# Patient Record
Sex: Female | Born: 1985 | Race: White | Hispanic: No | Marital: Single | Smoking: Never smoker
Health system: Southern US, Community
[De-identification: ages and names within clinical notes are randomized; demographics above are authoritative.]

## PROBLEM LIST (undated history)

## (undated) DIAGNOSIS — F410 Panic disorder [episodic paroxysmal anxiety] without agoraphobia: Secondary | ICD-10-CM

## (undated) DIAGNOSIS — G40909 Epilepsy, unspecified, not intractable, without status epilepticus: Secondary | ICD-10-CM

## (undated) DIAGNOSIS — R61 Generalized hyperhidrosis: Secondary | ICD-10-CM

## (undated) DIAGNOSIS — R002 Palpitations: Secondary | ICD-10-CM

## (undated) HISTORY — DX: Panic disorder (episodic paroxysmal anxiety): F41.0

## (undated) HISTORY — DX: Generalized hyperhidrosis: R61

## (undated) HISTORY — DX: Palpitations: R00.2

## (undated) HISTORY — DX: Epilepsy, unspecified, not intractable, without status epilepticus: G40.909

---
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
## 2008-04-01 ENCOUNTER — Emergency Department (HOSPITAL_COMMUNITY): Admission: EM | Admit: 2008-04-01 | Discharge: 2008-04-01 | Payer: Self-pay | Admitting: Emergency Medicine

## 2008-10-29 ENCOUNTER — Emergency Department (HOSPITAL_COMMUNITY): Admission: EM | Admit: 2008-10-29 | Discharge: 2008-10-29 | Payer: Self-pay | Admitting: Emergency Medicine

## 2008-12-03 ENCOUNTER — Ambulatory Visit (HOSPITAL_COMMUNITY): Admission: RE | Admit: 2008-12-03 | Discharge: 2008-12-04 | Payer: Self-pay | Admitting: Orthopaedic Surgery

## 2008-12-03 ENCOUNTER — Encounter (INDEPENDENT_AMBULATORY_CARE_PROVIDER_SITE_OTHER): Payer: Self-pay | Admitting: General Surgery

## 2010-06-28 ENCOUNTER — Emergency Department (HOSPITAL_COMMUNITY)
Admission: EM | Admit: 2010-06-28 | Discharge: 2010-06-29 | Payer: Self-pay | Source: Home / Self Care | Admitting: Emergency Medicine

## 2010-06-30 LAB — DIFFERENTIAL
Basophils Absolute: 0 10*3/uL (ref 0.0–0.1)
Basophils Relative: 0 % (ref 0–1)
Eosinophils Absolute: 0.1 10*3/uL (ref 0.0–0.7)
Eosinophils Relative: 2 % (ref 0–5)
Lymphocytes Relative: 27 % (ref 12–46)
Lymphs Abs: 1.7 10*3/uL (ref 0.7–4.0)
Monocytes Absolute: 0.5 10*3/uL (ref 0.1–1.0)
Monocytes Relative: 8 % (ref 3–12)
Neutro Abs: 4 10*3/uL (ref 1.7–7.7)
Neutrophils Relative %: 63 % (ref 43–77)

## 2010-06-30 LAB — COMPREHENSIVE METABOLIC PANEL
ALT: 10 U/L (ref 0–35)
AST: 15 U/L (ref 0–37)
Albumin: 3.9 g/dL (ref 3.5–5.2)
Alkaline Phosphatase: 31 U/L — ABNORMAL LOW (ref 39–117)
BUN: 12 mg/dL (ref 6–23)
CO2: 23 mEq/L (ref 19–32)
Calcium: 8.8 mg/dL (ref 8.4–10.5)
Chloride: 113 mEq/L — ABNORMAL HIGH (ref 96–112)
Creatinine, Ser: 0.59 mg/dL (ref 0.4–1.2)
GFR calc Af Amer: >60 mL/min (ref >60)
GFR calc non Af Amer: >60 mL/min (ref >60)
Glucose, Bld: 93 mg/dL (ref 70–99)
Potassium: 3.6 mEq/L (ref 3.5–5.1)
Sodium: 141 mEq/L (ref 135–145)
Total Bilirubin: 0.8 mg/dL (ref 0.3–1.2)
Total Protein: 6.6 g/dL (ref 6.0–8.3)

## 2010-06-30 LAB — URINALYSIS, ROUTINE W REFLEX MICROSCOPIC
Bilirubin Urine: NEGATIVE
Ketones, ur: NEGATIVE mg/dL
Leukocytes, UA: NEGATIVE
Nitrite: NEGATIVE
Protein, ur: NEGATIVE mg/dL
Specific Gravity, Urine: 1.027 (ref 1.005–1.030)
Urine Glucose, Fasting: NEGATIVE mg/dL
Urobilinogen, UA: 1 mg/dL (ref 0.0–1.0)
pH: 6 (ref 5.0–8.0)

## 2010-06-30 LAB — CBC
HCT: 37.6 % (ref 36.0–46.0)
Hemoglobin: 12.8 g/dL (ref 12.0–15.0)
MCH: 33.2 pg (ref 26.0–34.0)
MCHC: 34 g/dL (ref 30.0–36.0)
MCV: 97.4 fL (ref 78.0–100.0)
Platelets: 248 10*3/uL (ref 150–400)
RBC: 3.86 MIL/uL — ABNORMAL LOW (ref 3.87–5.11)
RDW: 12.5 % (ref 11.5–15.5)
WBC: 6.4 10*3/uL (ref 4.0–10.5)

## 2010-06-30 LAB — URINE MICROSCOPIC-ADD ON

## 2010-06-30 LAB — LIPASE, BLOOD: Lipase: 24 U/L (ref 11–59)

## 2010-06-30 LAB — POCT PREGNANCY, URINE: Preg Test, Ur: NEGATIVE

## 2010-09-22 LAB — BASIC METABOLIC PANEL
BUN: 11 mg/dL (ref 6–23)
CO2: 24 mEq/L (ref 19–32)
Calcium: 9.6 mg/dL (ref 8.4–10.5)
Chloride: 109 mEq/L (ref 96–112)
Creatinine, Ser: 0.86 mg/dL (ref 0.4–1.2)
GFR calc Af Amer: >60 mL/min (ref >60)
GFR calc non Af Amer: >60 mL/min (ref >60)
Glucose, Bld: 74 mg/dL (ref 70–99)
Potassium: 3.9 mEq/L (ref 3.5–5.1)
Sodium: 141 mEq/L (ref 135–145)

## 2010-09-22 LAB — CBC
HCT: 39.8 % (ref 36.0–46.0)
Hemoglobin: 13.5 g/dL (ref 12.0–15.0)
MCHC: 33.9 g/dL (ref 30.0–36.0)
MCV: 98.9 fL (ref 78.0–100.0)
Platelets: 235 10*3/uL (ref 150–400)
RBC: 4.03 MIL/uL (ref 3.87–5.11)
RDW: 12.6 % (ref 11.5–15.5)
WBC: 4.7 10*3/uL (ref 4.0–10.5)

## 2010-09-23 LAB — COMPREHENSIVE METABOLIC PANEL
ALT: 14 U/L (ref 0–35)
AST: 18 U/L (ref 0–37)
Albumin: 3.9 g/dL (ref 3.5–5.2)
Alkaline Phosphatase: 33 U/L — ABNORMAL LOW (ref 39–117)
BUN: 9 mg/dL (ref 6–23)
CO2: 25 mEq/L (ref 19–32)
Calcium: 8.6 mg/dL (ref 8.4–10.5)
Chloride: 109 mEq/L (ref 96–112)
Creatinine, Ser: 0.8 mg/dL (ref 0.4–1.2)
GFR calc Af Amer: >60 mL/min (ref >60)
GFR calc non Af Amer: >60 mL/min (ref >60)
Glucose, Bld: 93 mg/dL (ref 70–99)
Potassium: 3.1 mEq/L — ABNORMAL LOW (ref 3.5–5.1)
Sodium: 138 mEq/L (ref 135–145)
Total Bilirubin: 0.4 mg/dL (ref 0.3–1.2)
Total Protein: 5.9 g/dL — ABNORMAL LOW (ref 6.0–8.3)

## 2010-09-23 LAB — CBC
HCT: 37.8 % (ref 36.0–46.0)
Hemoglobin: 12.9 g/dL (ref 12.0–15.0)
MCHC: 34.1 g/dL (ref 30.0–36.0)
MCV: 99.8 fL (ref 78.0–100.0)
Platelets: 231 10*3/uL (ref 150–400)
RBC: 3.79 MIL/uL — ABNORMAL LOW (ref 3.87–5.11)
RDW: 12.6 % (ref 11.5–15.5)
WBC: 5.9 10*3/uL (ref 4.0–10.5)

## 2010-09-23 LAB — DIFFERENTIAL
Basophils Absolute: 0 10*3/uL (ref 0.0–0.1)
Basophils Relative: 0 % (ref 0–1)
Eosinophils Absolute: 0.2 10*3/uL (ref 0.0–0.7)
Eosinophils Relative: 3 % (ref 0–5)
Lymphocytes Relative: 37 % (ref 12–46)
Lymphs Abs: 2.2 10*3/uL (ref 0.7–4.0)
Monocytes Absolute: 0.6 10*3/uL (ref 0.1–1.0)
Monocytes Relative: 10 % (ref 3–12)
Neutro Abs: 2.9 10*3/uL (ref 1.7–7.7)
Neutrophils Relative %: 50 % (ref 43–77)

## 2010-09-23 LAB — URINALYSIS, ROUTINE W REFLEX MICROSCOPIC
Bilirubin Urine: NEGATIVE
Glucose, UA: NEGATIVE mg/dL
Hgb urine dipstick: NEGATIVE
Ketones, ur: NEGATIVE mg/dL
Nitrite: NEGATIVE
Protein, ur: NEGATIVE mg/dL
Specific Gravity, Urine: 1.017 (ref 1.005–1.030)
Urobilinogen, UA: 0.2 mg/dL (ref 0.0–1.0)
pH: 7.5 (ref 5.0–8.0)

## 2010-09-23 LAB — POCT PREGNANCY, URINE: Preg Test, Ur: NEGATIVE

## 2010-10-28 NOTE — Discharge Summary (Signed)
Jessica Stewart, Jessica Stewart               ACCOUNT NO.:  [id number]   MEDICAL RECORD NO.:  [id number]          PATIENT TYPE:  OIB   LOCATION:  5015                         FACILITY:  [facility]   PHYSICIAN:  Gabrielle Dare. Janee Morn, M.D.DATE OF BIRTH:  July 18, 1985   DATE OF ADMISSION:  12/03/2008  DATE OF DISCHARGE:  12/04/2008                               DISCHARGE SUMMARY   DISCHARGE DIAGNOSES:  1. Symptomatic cholelithiasis.  2. Status post laparoscopic cholecystectomy.  3. Seizure disorder.   HISTORY OF PRESENT ILLNESS:  Ms. Savarino presented for elective  cholecystectomy for symptomatic cholelithiasis.   HOSPITAL COURSE:  The patient underwent an uncomplicated laparoscopic  cholecystectomy with intraoperative cholangiogram.  Postoperatively, she  remained afebrile and hemodynamically stable.  She tolerated gradual  advancement of her diet.  She is maintained on her home antiseizure  medications and she was discharged home in stable condition on  postoperative day 1.   DISCHARGE DIET:  Low fat.   DISCHARGE ACTIVITY:  No lifting over 10 pounds.   DISCHARGE MEDICATIONS:  Oxycodone 5/325 one to two p.o. every 6 hours as  needed for pain.  In addition, she is continuing on her home medications  of Topamax 50 mg p.o. b.i.d., ondansetron 4 mg b.i.d., Lamictal 200 mg  b.i.d.   FOLLOWUP:  With myself in 2-3 weeks.      Gabrielle Dare Janee Morn, M.D.  Electronically Signed     BET/MEDQ  D:  12/04/2008  T:  12/04/2008  Job:  [id number]

## 2010-10-28 NOTE — Op Note (Signed)
NAMEHARUKA, Jessica Stewart               ACCOUNT NO.:  [id number]   MEDICAL RECORD NO.:  [id number]          PATIENT TYPE:  OIB   LOCATION:  5015                         FACILITY:  [facility]   PHYSICIAN:  Gabrielle Dare. Janee Morn, M.D.DATE OF BIRTH:  1985/11/30   DATE OF PROCEDURE:  12/03/2008  DATE OF DISCHARGE:                               OPERATIVE REPORT   PREOPERATIVE DIAGNOSIS:  Symptomatic cholelithiasis.   POSTOPERATIVE DIAGNOSIS:  Symptomatic cholelithiasis.   PROCEDURE:  Laparoscopic cholecystectomy with intraoperative  cholangiogram.   SURGEON:  Gabrielle Dare. Janee Morn, MD   ANESTHESIA:  General endotracheal.   HISTORY OF PRESENT ILLNESS:  Ms. Jessica Stewart is a 25 year old white female who  I evaluated in the office for symptomatic cholelithiasis.  She presents  today for elective cholecystectomy.   PROCEDURE IN DETAIL:  Informed consent was obtained.  The patient  identified in the preop holding area.  She received intravenous  antibiotics.  She was brought to the operating room and general  endotracheal anesthesia was administered by the anesthesia staff.  Her  abdomen was prepped and draped in a sterile fashion and time-out  procedure was performed.  An infraumbilical region was infiltrated with  0.25% Marcaine with epinephrine.  An infraumbilical incision was made.  Subcutaneous tissues were dissected down revealing the anterior fascia.  This was divided sharply.  The peritoneal cavity was entered under  direct vision without difficulty.  A 0 Vicryl pursestring suture was  placed around the fascial opening and a Hasson trocar was inserted into  the abdomen.  The abdomen was insufflated with carbon dioxide in a  standard fashion.  Under direct vision, a 11-mm epigastric and two 5-mm  mid lateral ports were placed.  A 0.25% Marcaine with epinephrine was  used at all port sites.  The dome of the gallbladder was retracted  superomedially.  This revealed a bunch of filmy omental adhesions to  the  body of the gallbladder.  These were gradually and gently taken down  using the hook cautery and blunt dissection revealing the infundibulum.  Infundibulum was retracted inferolaterally.  Dissection began laterally  and progressed medially first identified a large cystic artery.  This  was circumferentially dissected.  It was easily visualized and kind of  in the way of further dissection of the cystic duct.  So at this time,  it was clipped twice proximally and once distally and divided.  Next,  further dissection continued easily identifying the cystic duct.  Dissected continued until a large window was created between the cystic  duct infundibulum of the gallbladder, and the liver.  Once we had  excellent visualization, a clip was placed on the infundibulocystic duct  junction.  Small nick was made in the cystic duct, and Reddick  cholangiogram catheter was inserted, intraoperative cholangiogram was  then obtained, demonstrating no common bile duct filling defects and  good flow of contrast into the duodenum.  The cholangiogram catheter was  removed.  Three clips were placed proximally in the cystic duct and it  was divided.  The gallbladder was then taken off the liver bed with  Bovie  cautery.  We did encounter a small posterior branch of cystic  artery it was clipped twice proximally and cauterized distally.  The  gallbladder was taken off the liver bed and placed in an EndoCatch bag,  and it was removed from the abdomen via the infraumbilical port site.  The liver bed was copiously irrigated until irrigation fluid returned  clear.  The meticulous hemostasis was ensured at the liver bed.  All  clips remained in excellent position and there was no bleeding or bile  leakage.  The remainder of the irrigation fluid was evacuated and it was  clear.  The ports were then removed under direct vision.  The  pneumoperitoneum was released.  The infraumbilical fascia was closed by  tying  with 0-Vicryl pursestring suture with care not to trap any  intraabdominal contents.  All 4 wounds were copiously irrigated, and the  skin of each was closed with a running 4-0 Vicryl subcuticular stitch  followed by Dermabond.  Sponge, needle, and instrument counts were  correct.  The patient tolerated the procedure well without apparent  complications.  The patient was taken to the recovery room in stable  condition.      Gabrielle Dare Janee Morn, M.D.  Electronically Signed     BET/MEDQ  D:  12/03/2008  T:  12/04/2008  Job:  [id number]   cc:   Otilio Connors. Gerri Spore, M.D.  Dr. Inez Catalina

## 2011-03-16 LAB — RAPID URINE DRUG SCREEN, HOSP PERFORMED
Cocaine: NOT DETECTED
Tetrahydrocannabinol: NOT DETECTED

## 2011-03-16 LAB — URINALYSIS, ROUTINE W REFLEX MICROSCOPIC
Glucose, UA: NEGATIVE
Hgb urine dipstick: NEGATIVE
Leukocytes, UA: NEGATIVE
Protein, ur: 30 — AB
Specific Gravity, Urine: 1.024
pH: 6

## 2011-03-16 LAB — BASIC METABOLIC PANEL
CO2: 15 — ABNORMAL LOW
Chloride: 106
GFR calc Af Amer: >60
Glucose, Bld: 128 — ABNORMAL HIGH
Potassium: 3.1 — ABNORMAL LOW
Sodium: 137

## 2011-03-16 LAB — GLUCOSE, CAPILLARY: Glucose-Capillary: 77

## 2011-03-16 LAB — URINE MICROSCOPIC-ADD ON

## 2011-09-28 ENCOUNTER — Other Ambulatory Visit: Payer: Self-pay | Admitting: Obstetrics and Gynecology

## 2011-09-28 ENCOUNTER — Other Ambulatory Visit (HOSPITAL_COMMUNITY)
Admission: RE | Admit: 2011-09-28 | Discharge: 2011-09-28 | Disposition: A | Discharge status: Home / Self Care | Payer: BC Managed Care – PPO | Source: Ambulatory Visit | Attending: Obstetrics and Gynecology | Admitting: Obstetrics and Gynecology

## 2011-09-28 DIAGNOSIS — Z01419 Encounter for gynecological examination (general) (routine) without abnormal findings: Secondary | ICD-10-CM | POA: Insufficient documentation

## 2012-07-01 ENCOUNTER — Ambulatory Visit (HOSPITAL_COMMUNITY)
Admission: RE | Admit: 2012-07-01 | Discharge: 2012-07-01 | Disposition: A | Discharge status: Home / Self Care | Payer: 59 | Source: Ambulatory Visit | Attending: Family Medicine | Admitting: Family Medicine

## 2012-07-01 ENCOUNTER — Encounter (HOSPITAL_COMMUNITY): Payer: Self-pay

## 2012-07-01 ENCOUNTER — Other Ambulatory Visit (HOSPITAL_COMMUNITY): Payer: Self-pay | Admitting: Family Medicine

## 2012-07-01 DIAGNOSIS — Z9049 Acquired absence of other specified parts of digestive tract: Secondary | ICD-10-CM

## 2012-07-01 DIAGNOSIS — R109 Unspecified abdominal pain: Secondary | ICD-10-CM | POA: Insufficient documentation

## 2012-07-01 DIAGNOSIS — N83209 Unspecified ovarian cyst, unspecified side: Secondary | ICD-10-CM | POA: Insufficient documentation

## 2012-07-01 MED ORDER — IOHEXOL 300 MG/ML  SOLN
100.0000 mL | Freq: Once | INTRAMUSCULAR | Status: AC | PRN
  Administered 2012-07-01: 100 mL via INTRAVENOUS

## 2012-07-01 MED ORDER — IOHEXOL 300 MG/ML  SOLN
50.0000 mL | Freq: Once | INTRAMUSCULAR | Status: AC | PRN
  Administered 2012-07-01: 50 mL via ORAL

## 2013-01-02 IMAGING — CT CT ABD-PELV W/O CM
2 of 4 series · 17 of 46 positions shown, 19 images · non-contrast
Comparison: 10/29/2008

CLINICAL DATA: Intermittent left upper quadrant abdominal pain for
1 week.

CT ABDOMEN AND PELVIS WITHOUT CONTRAST
TECHNIQUE: Multidetector CT imaging of the abdomen and pelvis was
performed following the standard protocol without intravenous
contrast.

[Series 2: stone_wo 5.0 b40f st · axial · 0.69mm/px · z∈[-444,-64]mm · 14 of 84 slices shown, 16 images]
[im 4/84  soft-tissue]
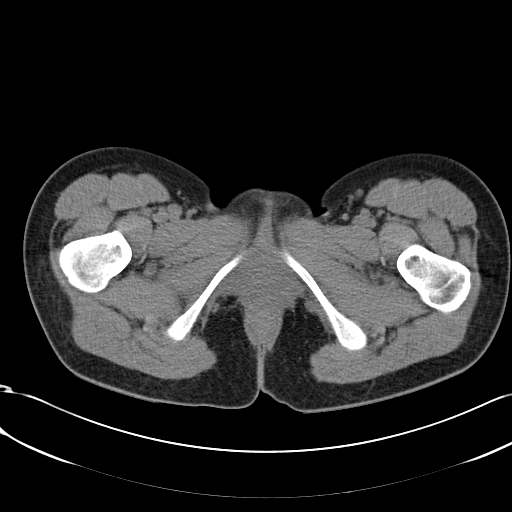
[im 4/84  bone]
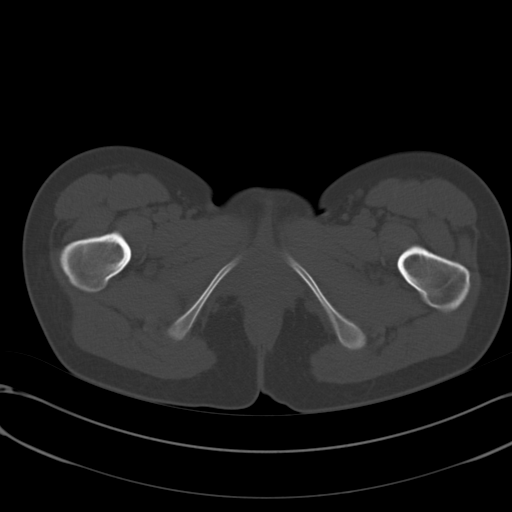
[im 10/84  soft-tissue]
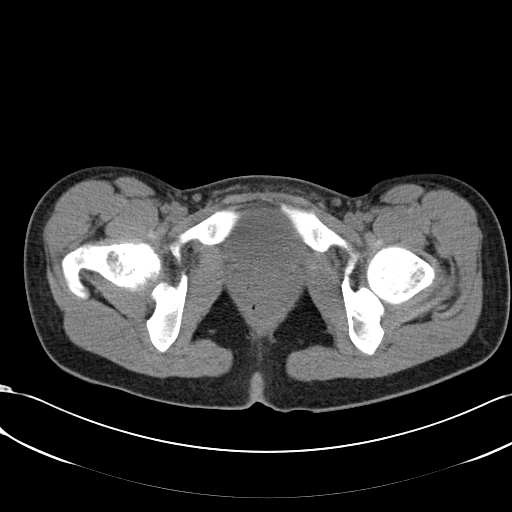
[im 17/84  soft-tissue]
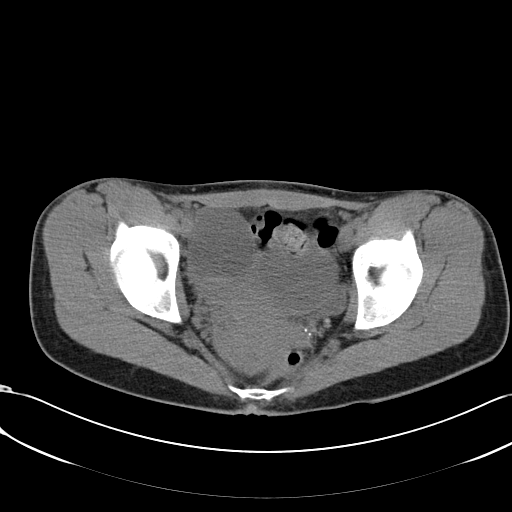
[im 24/84  soft-tissue]
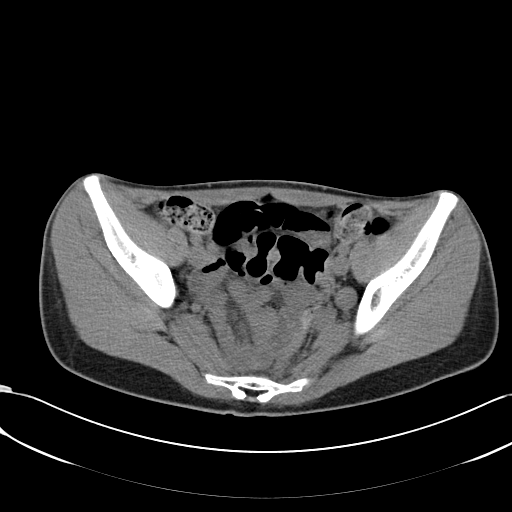
[im 27/84  soft-tissue]
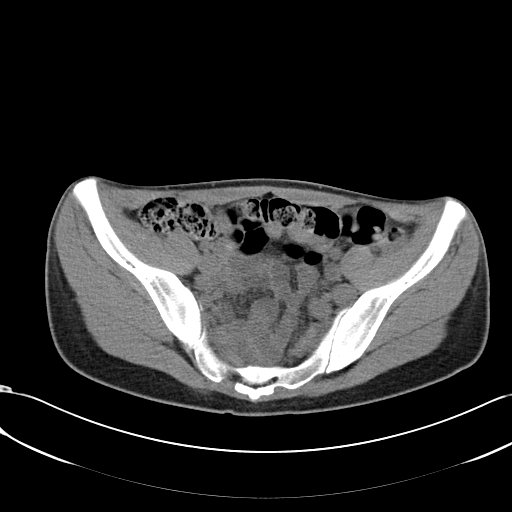
[im 34/84  soft-tissue]
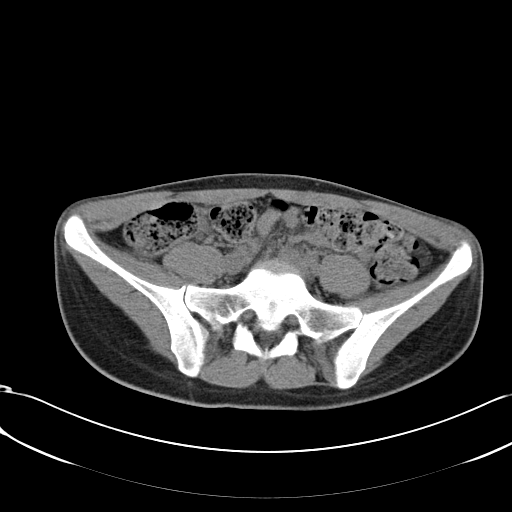
[im 40/84  soft-tissue]
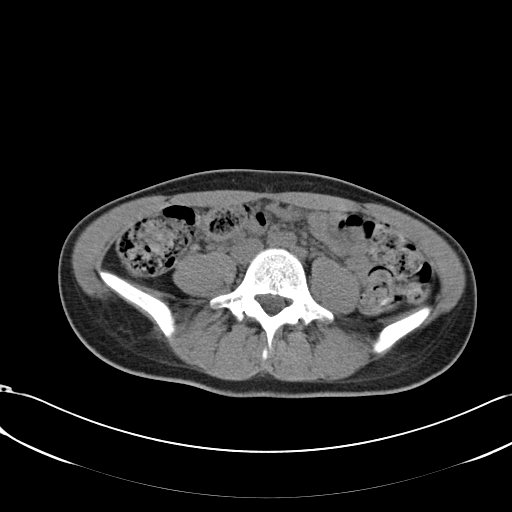
[im 44/84  soft-tissue]
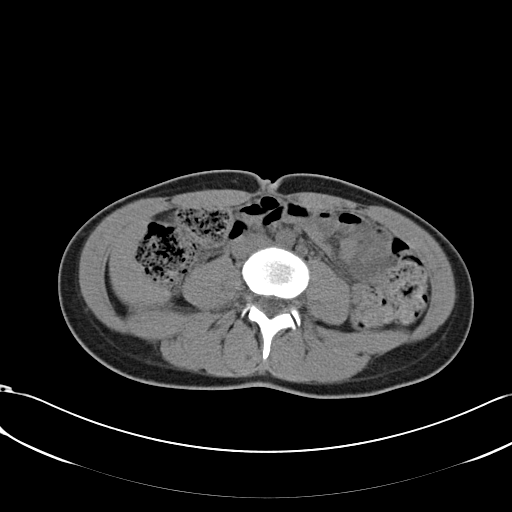
[im 50/84  soft-tissue]
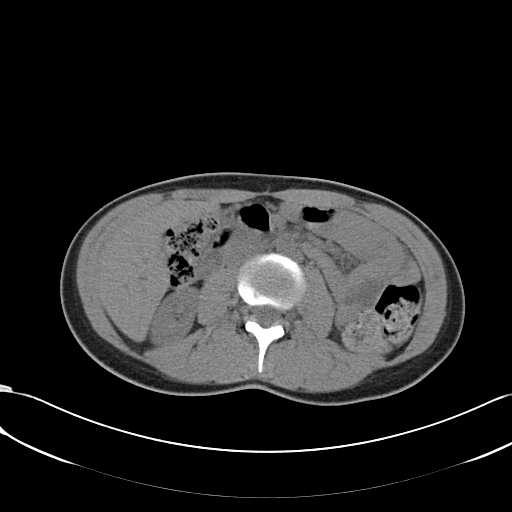
[im 50/84  bone]
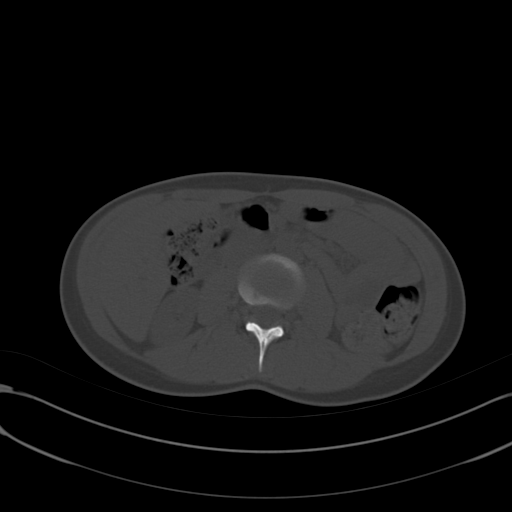
[im 57/84  soft-tissue]
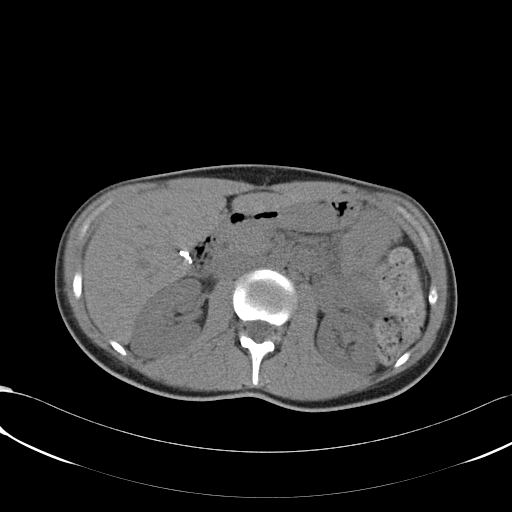
[im 64/84  soft-tissue]
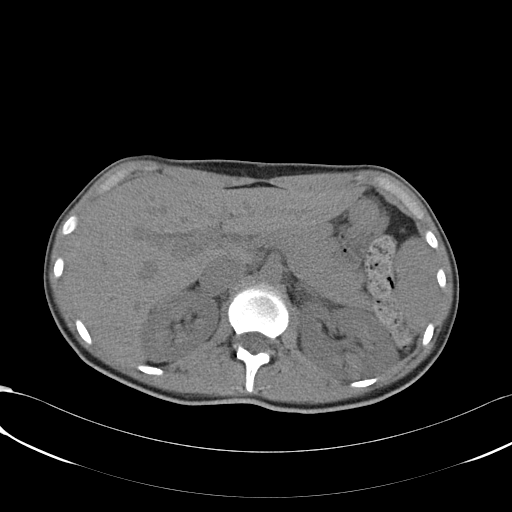
[im 67/84  soft-tissue]
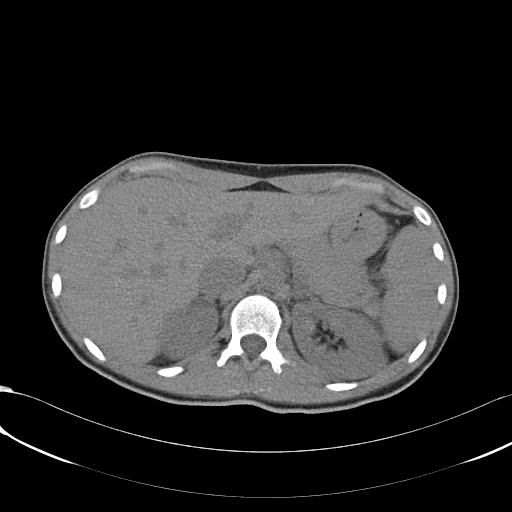
[im 74/84  soft-tissue]
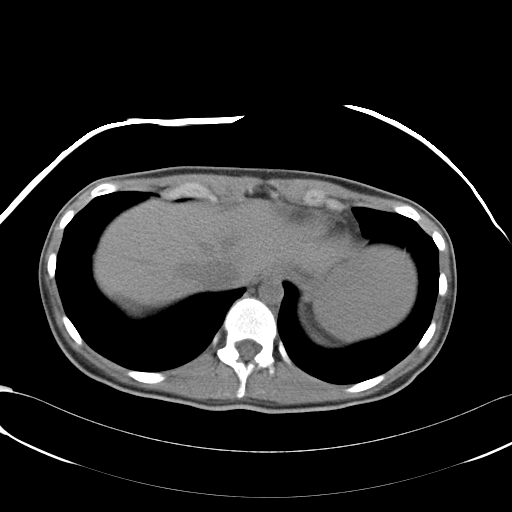
[im 80/84  soft-tissue]
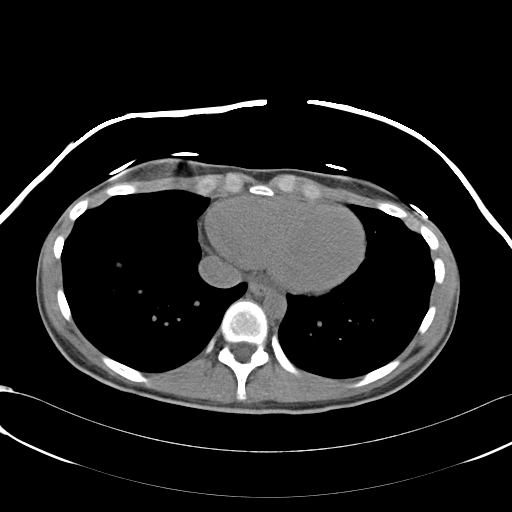

[Series 602: coronal abdomen · coronal · 0.86mm/px · 3 of 81 slices shown]
[im 27/81  soft-tissue]
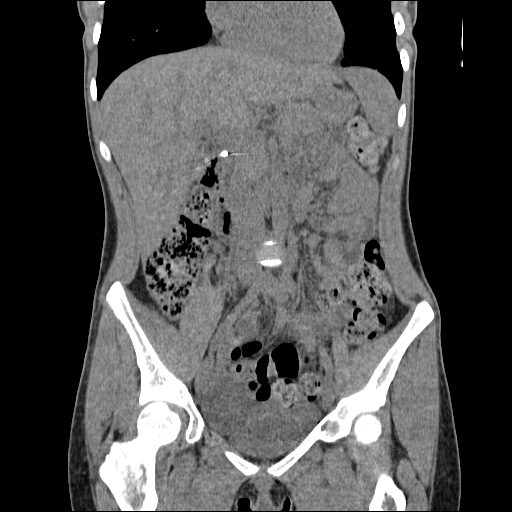
[im 36/81  soft-tissue]
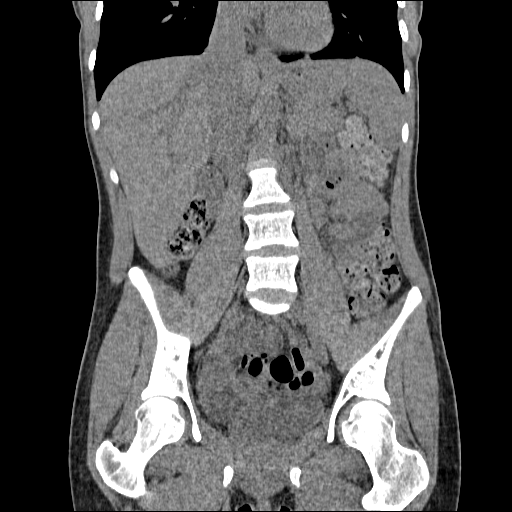
[im 45/81  soft-tissue]
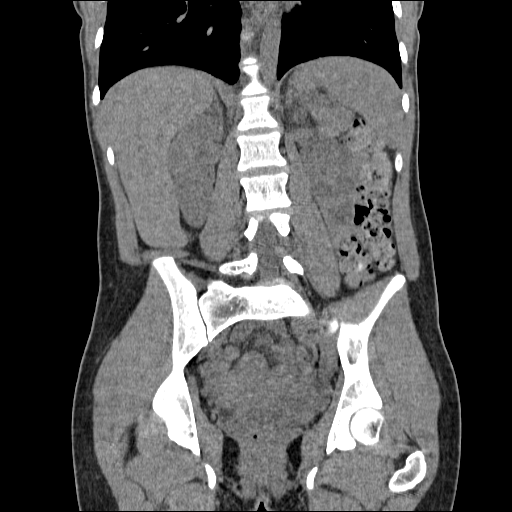

[17 of 46 positions shown; findings below may reference images not displayed]

FINDINGS: The visualized portion of the liver, spleen, pancreas,
and adrenal glands appear unremarkable in noncontrast CT
appearance.

The gallbladder is surgically absent.

No renal calculi identified.  No hydronephrosis or hydroureter.

Calcifications in the anatomic pelvis on the left side are probably
vascular, but more in the vicinity of the left distal ureter.  The
lack of hydroureter argues against a distal obstructing calculus.

Prominence of stool throughout the colon suggests constipation.  A
right adnexal cystic lesion measures 5.2 x 4.7 cm on image 69 of
series 2, and has faint dependent density possibly reflecting mild
complexity.  The left adnexal contour appears normal.

The appendix appears normal.
IMPRESSION: 1.  Cystic lesion of the right adnexa with some mild dependent
density.  This measures approximately 5 cm in diameter.  Pelvic
sonography may be warranted, although clearly a right ovarian cyst
is unlikely to cause left upper quadrant abdominal pain.
2.  No calculi identified.
3.  Prominence of stool throughout the colon suggests constipation.

## 2013-06-22 ENCOUNTER — Encounter: Payer: Self-pay | Admitting: *Deleted

## 2013-06-22 ENCOUNTER — Institutional Professional Consult (permissible substitution): Payer: BC Managed Care – PPO | Admitting: Cardiovascular Disease

## 2013-06-22 DIAGNOSIS — R002 Palpitations: Secondary | ICD-10-CM | POA: Insufficient documentation

## 2013-06-23 ENCOUNTER — Encounter: Payer: Self-pay | Admitting: *Deleted

## 2013-06-23 ENCOUNTER — Ambulatory Visit (INDEPENDENT_AMBULATORY_CARE_PROVIDER_SITE_OTHER): Payer: BC Managed Care – PPO | Admitting: Cardiovascular Disease

## 2013-06-23 VITALS — BP 102/64 | HR 76 | Ht 66.0 in | Wt 114.0 lb

## 2013-06-23 DIAGNOSIS — R002 Palpitations: Secondary | ICD-10-CM

## 2013-06-23 DIAGNOSIS — R079 Chest pain, unspecified: Secondary | ICD-10-CM

## 2013-06-23 DIAGNOSIS — I7300 Raynaud's syndrome without gangrene: Secondary | ICD-10-CM

## 2013-06-23 DIAGNOSIS — F410 Panic disorder [episodic paroxysmal anxiety] without agoraphobia: Secondary | ICD-10-CM

## 2013-06-23 DIAGNOSIS — R61 Generalized hyperhidrosis: Secondary | ICD-10-CM | POA: Insufficient documentation

## 2013-06-23 NOTE — Patient Instructions (Signed)
Your physician recommends that you schedule a follow-up appointment in:   AS NEEDED   Your physician recommends that you continue on your current medications as directed. Please refer to the Current Medication list given to you today.   Your physician has requested that you have a stress echocardiogram. For further information please visit [email or web address]. Please follow instruction sheet as given.  

## 2013-06-23 NOTE — Assessment & Plan Note (Signed)
Discussed wearing gloves and keeping hands warm  No signs of other connective tissue disease

## 2013-06-23 NOTE — Assessment & Plan Note (Signed)
Related to palpitations and panic attacks  STress echo in light of constellation of symptoms  Normal ECG

## 2013-06-23 NOTE — Assessment & Plan Note (Signed)
Continue citalopram f/u primary Likely source of palpitations

## 2013-06-23 NOTE — Progress Notes (Signed)
Patient ID: Jessica HolsterHeather M Stewart, female   DOB: August 18, 1985, 28 y.o.   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 She was seen around Christmas  ? 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  Lightheadedness and "intense worry"  Occurs mostly at work but has had palpitations at home  Don't last more than 10-15 minutes  Some chest tightness when they are but  Nothing relieves.  Started on citalopram 12/26 and things have been some better.  Does not have excess caffeine ETOH or other stimulants.  Grandmother has some tachycardia.  Younger sister ok and no other family history.  She is introverted and like to read Not very active no syncope  Does appear to have Raynauds with hands getting cold very easily with blanching         ROS: Denies fever, malais, weight loss, blurry vision, decreased visual acuity, cough, sputum, SOB, hemoptysis, pleuritic pain, palpitaitons, heartburn, abdominal pain, melena, lower extremity edema, claudication, or rash.  All other systems reviewed and negative   General: Affect appropriate Healthy:  appears stated age HEENT: normal Neck supple with no adenopathy JVP normal no bruits no thyromegaly Lungs clear with no wheezing and good diaphragmatic motion Heart:  S1/S2 no murmur,rub, gallop or click PMI normal Abdomen: benighn, BS positve, no tenderness, no AAA no bruit.  No HSM or HJR Distal pulses intact with no bruits No edema Neuro non-focal Skin warm and dry No muscular weakness  Medications Current Outpatient Prescriptions  Medication Sig Dispense Refill  . ALPRAZolam (XANAX) 0.25 MG tablet Take 0.25 mg by mouth daily as needed for anxiety.      . cetirizine (ZYRTEC) 10 MG tablet Take 10 mg by mouth 2 (two) times daily as needed for allergies.      . citalopram (CELEXA) 10 MG tablet Take 10 mg by mouth daily.      Marland Kitchen. ibuprofen (ADVIL,MOTRIN) 200 MG tablet Take 200 mg  by mouth as needed.      . norelgestromin-ethinyl estradiol (ORTHO EVRA) 150-35 MCG/24HR transdermal patch Place 1 patch onto the skin once a week.      . topiramate (TOPAMAX) 100 MG tablet Take 100 mg by mouth 2 (two) times daily.       No current facility-administered medications for this visit.    Allergies Metronidazole; Penicillins; and Antihistamines, loratadine-type  Family History: No family history on file.  Social History: History   Social History  . Marital Status: Single    Spouse Name: N/A    Number of Children: N/A  . Years of Education: N/A   Occupational History  . Not on file.   Social History Main Topics  . Smoking status: Never Smoker   . Smokeless tobacco: Never Used  . Alcohol Use: Yes  . Drug Use: No  . Sexual Activity: Yes    Birth Control/ Protection: Patch   Other Topics Concern  . Not on file   Social History Narrative  . No narrative on file    Electrocardiogram:  06/09/13  NSR normal ECG   Assessment and Plan

## 2013-06-23 NOTE — Assessment & Plan Note (Signed)
Likely adrenergically mediated from panic  ECG normal No high risk family history  Offered PRN inderal if needed  No need for monitor

## 2013-07-14 ENCOUNTER — Other Ambulatory Visit (HOSPITAL_COMMUNITY): Payer: BC Managed Care – PPO

## 2013-07-17 ENCOUNTER — Other Ambulatory Visit: Payer: Self-pay | Admitting: *Deleted

## 2013-07-17 DIAGNOSIS — R079 Chest pain, unspecified: Secondary | ICD-10-CM

## 2013-07-17 DIAGNOSIS — R002 Palpitations: Secondary | ICD-10-CM

## 2013-07-18 ENCOUNTER — Other Ambulatory Visit (HOSPITAL_COMMUNITY): Payer: BC Managed Care – PPO

## 2013-08-09 ENCOUNTER — Other Ambulatory Visit (HOSPITAL_COMMUNITY): Payer: BC Managed Care – PPO

## 2013-08-17 ENCOUNTER — Encounter: Payer: BC Managed Care – PPO | Admitting: Physician Assistant

## 2013-08-24 ENCOUNTER — Encounter: Payer: Self-pay | Admitting: Physician Assistant

## 2013-11-24 ENCOUNTER — Other Ambulatory Visit: Payer: Self-pay | Admitting: Family Medicine

## 2013-11-24 DIAGNOSIS — R102 Pelvic and perineal pain: Secondary | ICD-10-CM

## 2013-11-27 ENCOUNTER — Ambulatory Visit
Admission: RE | Admit: 2013-11-27 | Discharge: 2013-11-27 | Disposition: A | Discharge status: Home / Self Care | Payer: BC Managed Care – PPO | Source: Ambulatory Visit | Attending: Family Medicine | Admitting: Family Medicine

## 2013-11-27 DIAGNOSIS — R102 Pelvic and perineal pain: Secondary | ICD-10-CM

## 2013-12-01 ENCOUNTER — Ambulatory Visit (HOSPITAL_COMMUNITY)
Admission: RE | Admit: 2013-12-01 | Discharge: 2013-12-01 | Disposition: A | Discharge status: Home / Self Care | Payer: BC Managed Care – PPO | Source: Ambulatory Visit | Attending: Family Medicine | Admitting: Family Medicine

## 2013-12-01 ENCOUNTER — Other Ambulatory Visit (HOSPITAL_COMMUNITY): Payer: Self-pay | Admitting: Family Medicine

## 2013-12-01 ENCOUNTER — Encounter (HOSPITAL_COMMUNITY): Payer: Self-pay

## 2013-12-01 DIAGNOSIS — R109 Unspecified abdominal pain: Secondary | ICD-10-CM

## 2013-12-01 DIAGNOSIS — N83209 Unspecified ovarian cyst, unspecified side: Secondary | ICD-10-CM | POA: Insufficient documentation

## 2013-12-01 MED ORDER — IOHEXOL 300 MG/ML  SOLN
100.0000 mL | Freq: Once | INTRAMUSCULAR | Status: AC | PRN
  Administered 2013-12-01: 100 mL via INTRAVENOUS

## 2013-12-18 ENCOUNTER — Encounter: Payer: Self-pay | Admitting: Cardiovascular Disease

## 2013-12-25 ENCOUNTER — Encounter: Payer: Self-pay | Admitting: Nurse Practitioner

## 2014-01-19 ENCOUNTER — Encounter: Payer: BC Managed Care – PPO | Admitting: Physician Assistant

## 2014-02-06 ENCOUNTER — Encounter: Payer: Self-pay | Admitting: Nurse Practitioner

## 2014-02-06 ENCOUNTER — Ambulatory Visit (INDEPENDENT_AMBULATORY_CARE_PROVIDER_SITE_OTHER): Payer: BC Managed Care – PPO | Admitting: Nurse Practitioner

## 2014-02-06 DIAGNOSIS — R002 Palpitations: Secondary | ICD-10-CM

## 2014-02-06 DIAGNOSIS — R079 Chest pain, unspecified: Secondary | ICD-10-CM

## 2014-02-06 NOTE — Progress Notes (Signed)
Exercise Treadmill Test  Pre-Exercise Testing Evaluation Rhythm: normal sinus  Rate: 76 bpm     Test  Exercise Tolerance Test Ordering MD: Charlton Haws, MD  Interpreting MD: Norma Fredrickson, NP  Unique Test No: 1  Treadmill:  1  Indication for ETT: chest pain - rule out ischemia  Contraindication to ETT: No   Stress Modality: exercise - treadmill  Cardiac Imaging Performed: non   Protocol: standard Bruce - maximal  Max BP:  154/65  Max MPHR (bpm):  195 85% MPR (bpm):  166  MPHR obtained (bpm):  188 % MPHR obtained:  95%  Reached 85% MPHR (min:sec):  5:55 Total Exercise Time (min-sec):  8 minutes  Workload in METS:  10.1 Borg Scale: 14  Reason ETT Terminated:  desired heart rate attained    ST Segment Analysis At Rest: normal ST segments - no evidence of significant ST depression With Exercise: no evidence of significant ST depression  Other Information Arrhythmia:  No Angina during ETT:  absent (0) Quality of ETT:  diagnostic  ETT Interpretation:  normal - no evidence of ischemia by ST analysis  Comments: Patient presents today for routine GXT. Has had palpitations with history of panic attacks/seizures.  Currently doing well. No chest pain. Palpitations currently not issue. Neurology wanting to rule out arrhythmia with exercise.   Today the patient exercised on the standard Bruce protocol for a total of 8 minutes.  Good exercise tolerance.  Adequate blood pressure response.  Clinically negative for chest pain. Test was stopped due to achievement of target HR.  EKG negative for ischemia. No arrhythmia noted.   Recommendations: CV risk factor modification.  See back as needed.   Patient is agreeable to this plan and will call if any problems develop in the interim.   Rosalio Macadamia, RN, ANP-C [facility] [address] [place], [place]  [address] [phone]

## 2014-06-27 ENCOUNTER — Other Ambulatory Visit: Payer: Self-pay | Admitting: Gastroenterology

## 2016-06-02 IMAGING — US US TRANSVAGINAL NON-OB
1 series · 13 of 25 positions shown · non-contrast
Comparison: CT ABD/PELVIS W CM dated 07/01/2012; US PELVIS
TRANSVAGINAL dated 04/01/2011

CLINICAL DATA: PELVIC PAIN H/O OVARIAN CYST

EXAM:
TRANSABDOMINAL AND TRANSVAGINAL ULTRASOUND OF PELVIS
TECHNIQUE: Both transabdominal and transvaginal ultrasound examinations of the
pelvis were performed. Transabdominal technique was performed for
global imaging of the pelvis including uterus, ovaries, adnexal
regions, and pelvic cul-de-sac. It was necessary to proceed with
endovaginal exam following the transabdominal exam to visualize the
uterus, endometrium, ovaries and adnexal regions..

[Series 1: us transvaginal non-ob · 0.25mm/px · 81 acquisitions, 13 frames shown]
[im 1/81]
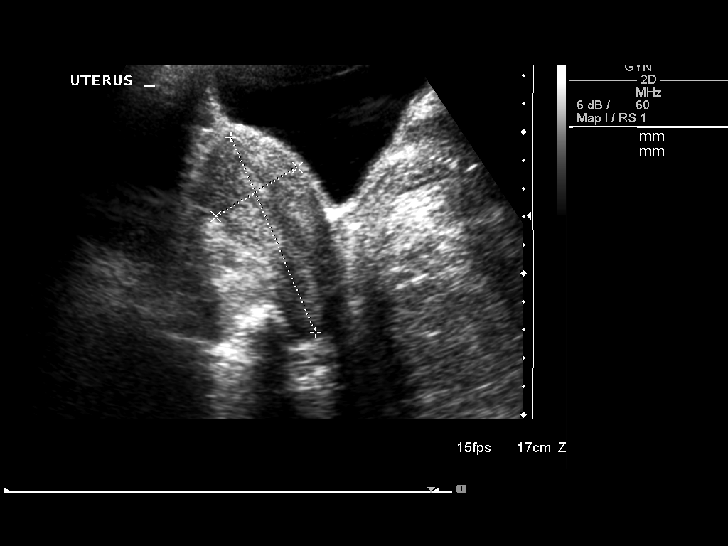
[im 7/81]
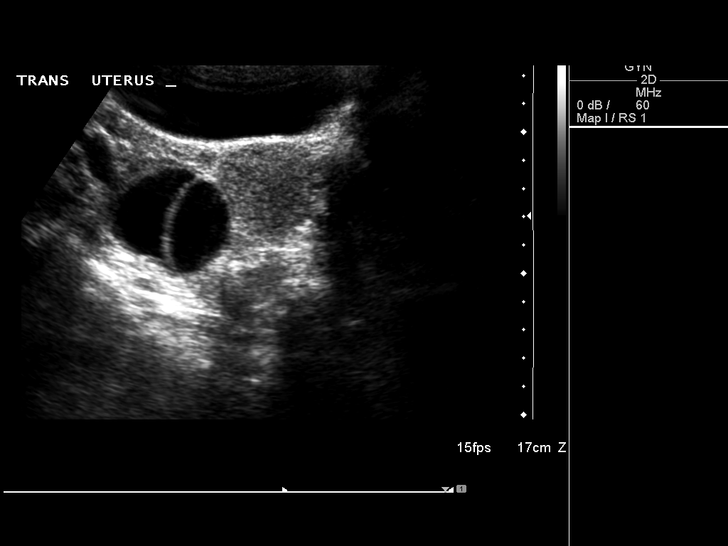
[im 14/81]
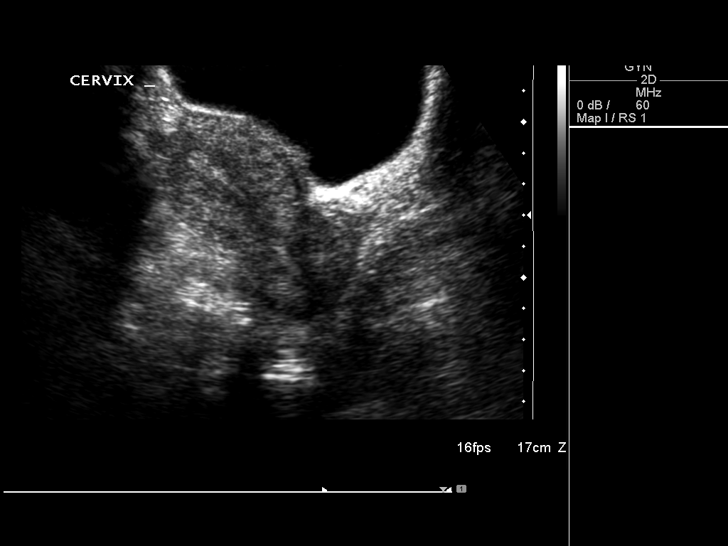
[im 21/81]
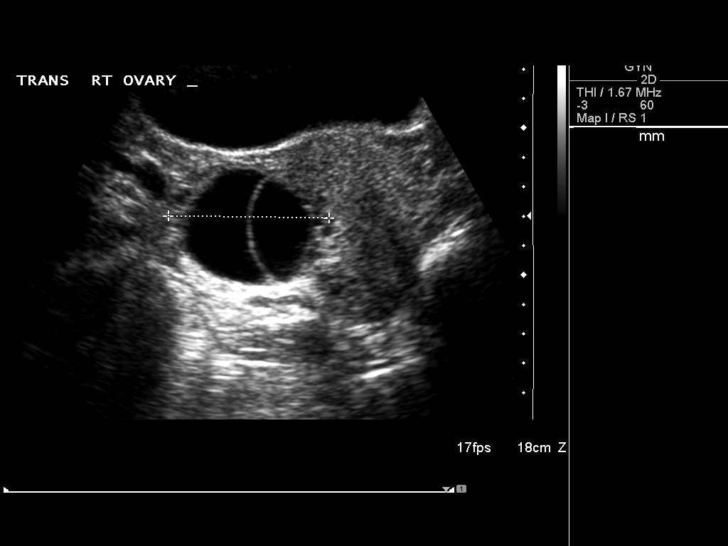
[im 27/81]
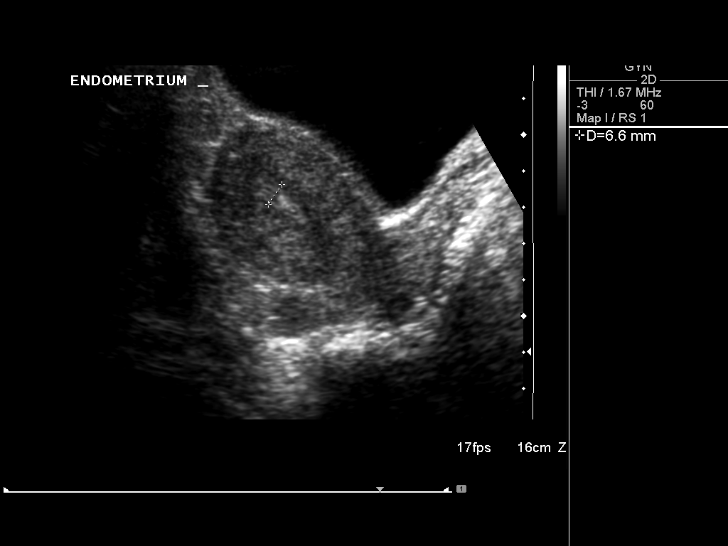
[im 34/81]
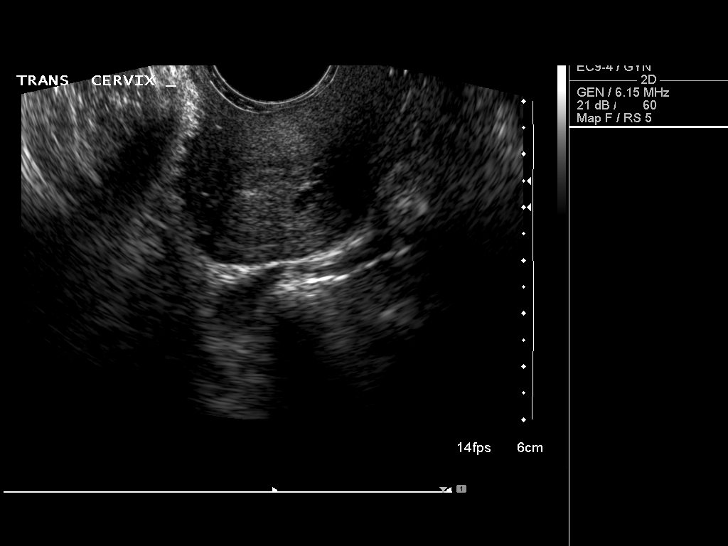
[im 41/81]
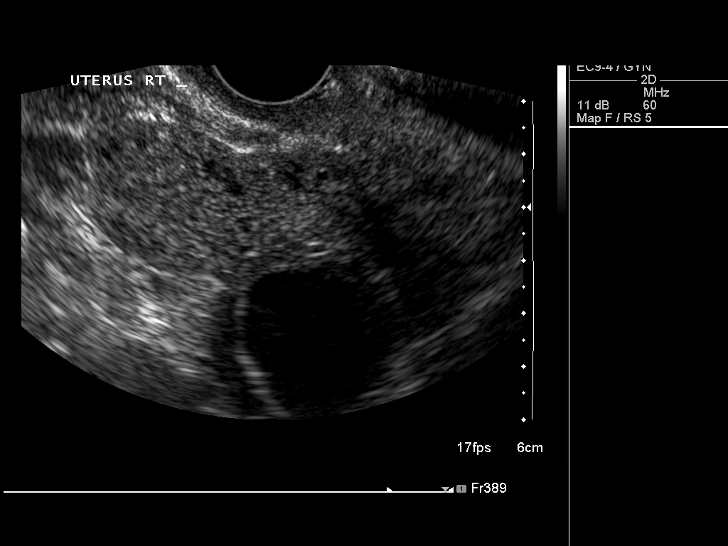
[im 47/81]
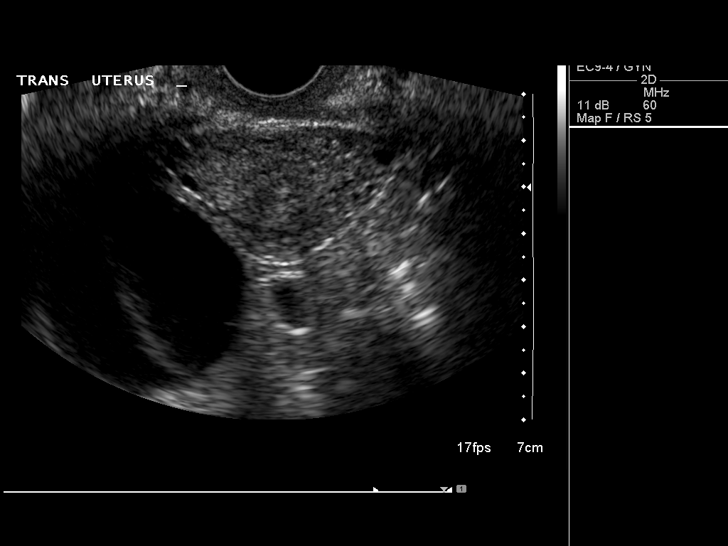
[im 54/81]
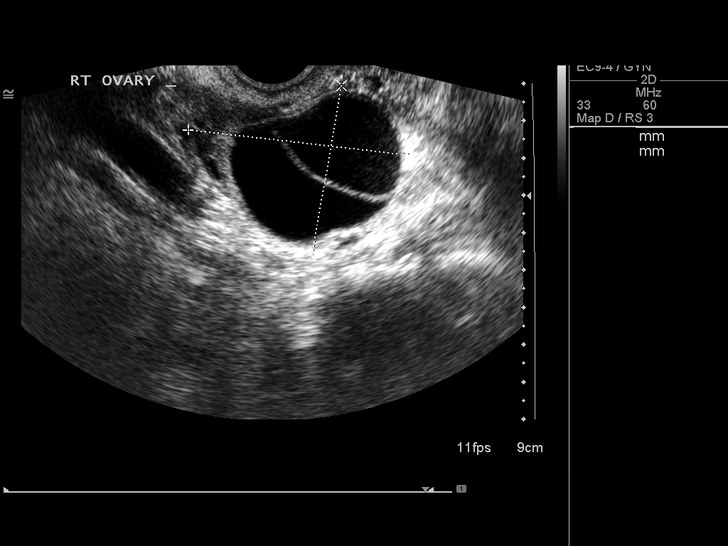
[im 61/81]
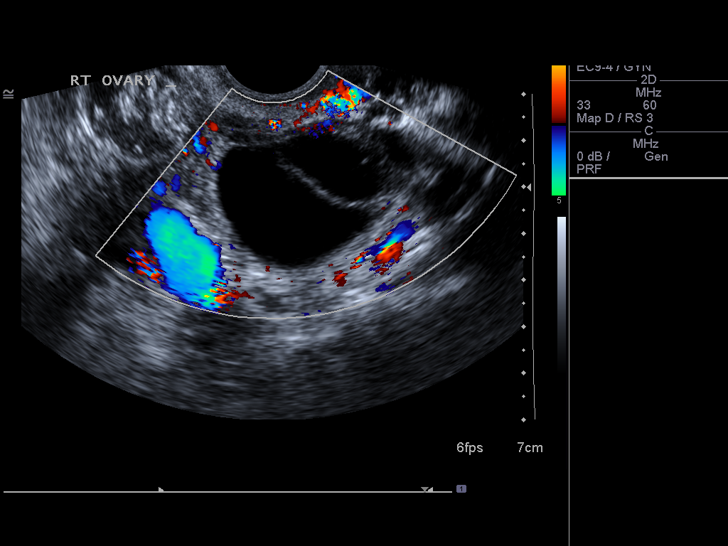
[im 67/81]
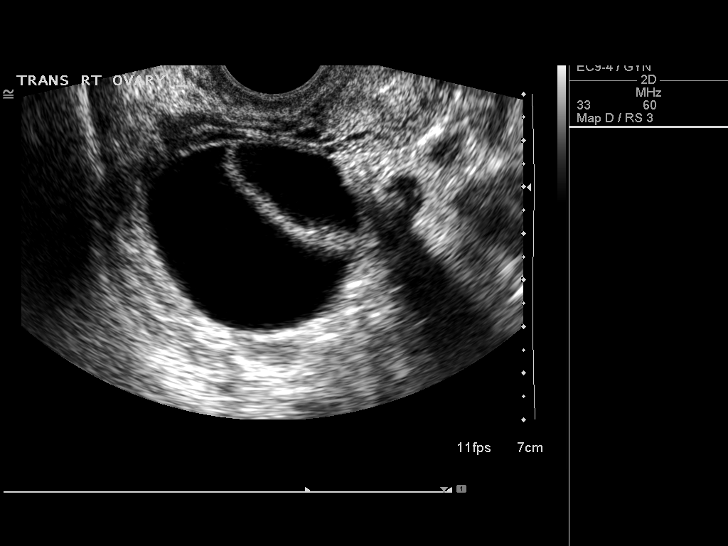
[im 74/81]
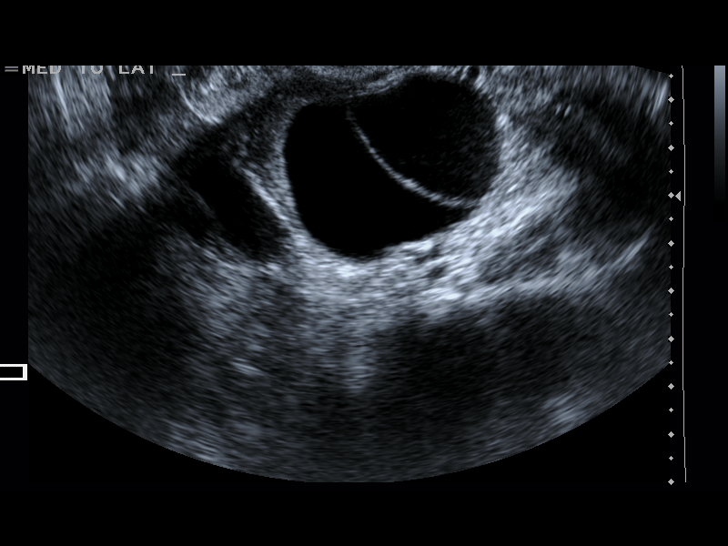
[im 81/81]
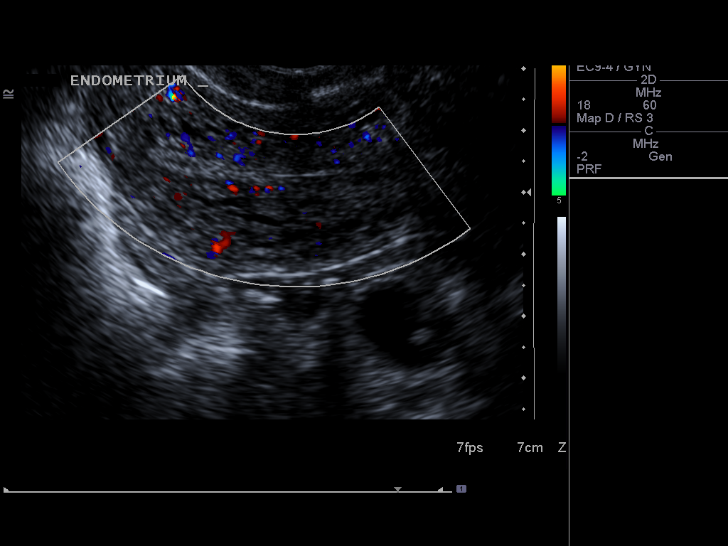

[13 of 25 positions shown; findings below may reference images not displayed]

FINDINGS: Uterus

Measurements: 8.2 x 3.5 x 4.9 cm. No fibroids or other mass
visualized.

Endometrium

Thickness: 5 mm.  No focal abnormality visualized.

Right ovary

Measurements: 6 x 4.6 x 5.1 cm. A 4.7 x 3.9 x 4.8 cm cyst is
appreciated containing a thickened septation and an area of
asymmetric wall thickening with questionable vascularity.

Left ovary

Measurements: 3.2 x 2.3 x 2.9 cm. Normal appearance/no adnexal mass.

Other findings

No free fluid.
IMPRESSION: Indeterminate complex cystic mass involving the right ovary.
Surgical consultation recommended. This recommendation follows the
consensus statement: Management of Adnexal Cysts Imaged at US:
Society of Radiologists in Ultrasound Consensus Conference
